# Patient Record
Sex: Female | Born: 1955 | Race: Black or African American | Hispanic: No | Marital: Single | Smoking: Never smoker
Health system: Southern US, Community
[De-identification: ages and names within clinical notes are randomized; demographics above are authoritative.]

## PROBLEM LIST (undated history)

## (undated) DIAGNOSIS — E7800 Pure hypercholesterolemia, unspecified: Secondary | ICD-10-CM

## (undated) DIAGNOSIS — I10 Essential (primary) hypertension: Secondary | ICD-10-CM

## (undated) DIAGNOSIS — E119 Type 2 diabetes mellitus without complications: Secondary | ICD-10-CM

## (undated) HISTORY — PX: REPLACEMENT TOTAL KNEE: SUR1224

## (undated) HISTORY — PX: BREAST EXCISIONAL BIOPSY: SUR124

## (undated) HISTORY — PX: BREAST SURGERY: SHX581

## (undated) HISTORY — PX: TUBAL LIGATION: SHX77

---

## 2004-03-14 ENCOUNTER — Emergency Department: Payer: Self-pay | Admitting: General Practice

## 2005-05-24 ENCOUNTER — Ambulatory Visit: Payer: Self-pay | Admitting: Unknown Physician Specialty

## 2005-06-20 ENCOUNTER — Ambulatory Visit: Payer: Self-pay | Admitting: Unknown Physician Specialty

## 2006-01-07 ENCOUNTER — Ambulatory Visit: Payer: Self-pay | Admitting: Unknown Physician Specialty

## 2006-01-26 ENCOUNTER — Emergency Department: Payer: Self-pay | Admitting: Emergency Medicine

## 2006-02-05 ENCOUNTER — Ambulatory Visit: Payer: Self-pay | Admitting: Unknown Physician Specialty

## 2006-04-16 ENCOUNTER — Ambulatory Visit: Payer: Self-pay | Admitting: Gastroenterology

## 2007-05-15 ENCOUNTER — Ambulatory Visit: Payer: Self-pay | Admitting: General Surgery

## 2007-05-15 ENCOUNTER — Other Ambulatory Visit: Payer: Self-pay

## 2007-05-21 ENCOUNTER — Ambulatory Visit: Payer: Self-pay | Admitting: General Surgery
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
## 2008-07-04 ENCOUNTER — Ambulatory Visit: Payer: Self-pay | Admitting: Unknown Physician Specialty

## 2008-07-06 ENCOUNTER — Ambulatory Visit: Payer: Self-pay | Admitting: Unknown Physician Specialty

## 2009-07-07 ENCOUNTER — Ambulatory Visit: Payer: Self-pay | Admitting: Unknown Physician Specialty

## 2010-05-29 ENCOUNTER — Ambulatory Visit: Payer: Self-pay | Admitting: Unknown Physician Specialty

## 2010-07-09 ENCOUNTER — Ambulatory Visit: Payer: Self-pay | Admitting: Unknown Physician Specialty

## 2015-12-30 ENCOUNTER — Encounter: Payer: Self-pay | Admitting: Gynecology

## 2015-12-30 ENCOUNTER — Ambulatory Visit
Admission: EM | Admit: 2015-12-30 | Discharge: 2015-12-30 | Disposition: A | Discharge status: Home / Self Care | Payer: 59

## 2015-12-30 ENCOUNTER — Ambulatory Visit (INDEPENDENT_AMBULATORY_CARE_PROVIDER_SITE_OTHER): Payer: 59

## 2015-12-30 DIAGNOSIS — M1712 Unilateral primary osteoarthritis, left knee: Secondary | ICD-10-CM

## 2015-12-30 HISTORY — DX: Pure hypercholesterolemia, unspecified: E78.00

## 2015-12-30 HISTORY — DX: Essential (primary) hypertension: I10

## 2015-12-30 HISTORY — DX: Type 2 diabetes mellitus without complications: E11.9

## 2015-12-30 MED ORDER — NAPROXEN 500 MG PO TABS: 500.0000 mg | ORAL_TABLET | Freq: Two times a day (BID) | ORAL | 0 refills | Status: AC

## 2015-12-30 MED ORDER — TRAMADOL HCL 50 MG PO TABS: 50.0000 mg | ORAL_TABLET | Freq: Two times a day (BID) | ORAL | 0 refills | Status: AC

## 2015-12-30 NOTE — ED Triage Notes (Signed)
Patient c/o right knee pain and swelling x couple weeks.

## 2015-12-30 NOTE — ED Provider Notes (Signed)
CSN: [id number]     Arrival date & time 12/30/15  1126 History   First MD Initiated Contact with Patient 12/30/15 1257     Chief Complaint  Patient presents with  . Knee Pain  . Joint Swelling   (Consider location/radiation/quality/duration/timing/severity/associated sxs/prior Treatment) HPI  This a 60 year old female who presents with left knee pain and swelling that she has had for about one month. 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 Since beginning the course she has had pain in her left knee. She has had previous right TKA from osteoarthritis and was told in 2008 at the time of the surgery that she would likely require on the opposite knee as well. She has been trying to treat it herself and has been using medications that she has had in the past but has run out and does not have an appointment at the [facility] until November 16. She has been limping because of the pain.       Past Medical History:  Diagnosis Date  . Diabetes mellitus without complication (HCC)   . High cholesterol   . Hypertension    Past Surgical History:  Procedure Laterality Date  . BREAST SURGERY    . CESAREAN SECTION    . REPLACEMENT TOTAL KNEE Left   . TUBAL LIGATION     No family history on file. Social History  Substance Use Topics  . Smoking status: Never Smoker  . Smokeless tobacco: Never Used  . Alcohol use Yes   OB History    No data available     Review of Systems  Constitutional: Positive for activity change. Negative for chills, fatigue and fever.  Musculoskeletal: Positive for arthralgias, gait problem and joint swelling.  All other systems reviewed and are negative.   Allergies  Penicillins  Home Medications   Prior to Admission medications   Medication Sig Start Date End Date Taking? Authorizing Provider  albuterol (PROVENTIL HFA;VENTOLIN HFA) 108 (90 Base) MCG/ACT inhaler Inhale into the lungs every 6 (six) hours as needed  for wheezing or shortness of breath.   Yes Historical Provider, MD  amLODipine (NORVASC) 10 MG tablet Take 10 mg by mouth daily.   Yes Historical Provider, MD  etodolac (LODINE XL) 400 MG 24 hr tablet Take 400 mg by mouth daily.   Yes Historical Provider, MD  fluticasone (FLONASE) 50 MCG/ACT nasal spray Place into both nostrils daily.   Yes Historical Provider, MD  hydrochlorothiazide (HYDRODIURIL) 25 MG tablet Take 25 mg by mouth daily.   Yes Historical Provider, MD  lisinopril (PRINIVIL,ZESTRIL) 40 MG tablet Take 40 mg by mouth daily.   Yes Historical Provider, MD  loratadine (CLARITIN) 10 MG tablet Take 10 mg by mouth daily.   Yes Historical Provider, MD  Melatonin 5 MG CAPS Take by mouth.   Yes Historical Provider, MD  metFORMIN (GLUCOPHAGE) 1000 MG tablet Take 1,000 mg by mouth 2 (two) times daily with a meal.   Yes Historical Provider, MD  methocarbamol (ROBAXIN) 500 MG tablet Take 500 mg by mouth 4 (four) times daily.   Yes Historical Provider, MD  pravastatin (PRAVACHOL) 80 MG tablet Take 80 mg by mouth daily.   Yes Historical Provider, MD  traMADol (ULTRAM) 50 MG tablet Take by mouth every 6 (six) hours as needed.   Yes Historical Provider, MD  naproxen (NAPROSYN) 500 MG tablet Take 1 tablet (500 mg total) by mouth 2 (two) times daily with a meal.  12/30/15   Lutricia Feil, PA-C  traMADol (ULTRAM) 50 MG tablet Take 1 tablet (50 mg total) by mouth 2 (two) times daily. 12/30/15   Lutricia Feil, PA-C   Meds Ordered and Administered this Visit  Medications - No data to display  BP (!) 146/75 (BP Location: Left Arm)   Pulse 71   Temp 98.1 F (36.7 C) (Oral)   Resp 16   Ht 5\' 2"  (1.575 m)   Wt 175 lb (79.4 kg)   SpO2 98%   BMI 32.01 kg/m  No data found.   Physical Exam  Constitutional: She is oriented to person, place, and time. She appears well-developed and well-nourished. No distress.  HENT:  Head: Normocephalic and atraumatic.  Eyes: EOM are normal. Pupils are equal,  round, and reactive to light.  Neck: Normal range of motion. Neck supple.  Musculoskeletal: Normal range of motion. She exhibits edema and tenderness.  Examination of the left knee shows the patient to have an antalgic gait. The knee is not hot. The quadriceps is strong with good control. She extends to 0. She does have some retropatellar tenderness. There is bilateral joint line tenderness most noticeable medially. Medial collateral and lateral collateral ligaments are intact to stressing. Is a negative anterior drawer sign.  Neurological: She is alert and oriented to person, place, and time.  Skin: Skin is warm and dry. She is not diaphoretic.  Psychiatric: She has a normal mood and affect. Her behavior is normal. Judgment and thought content normal.  Nursing note and vitals reviewed.   Urgent Care Course   Clinical Course    Procedures (including critical care time)  Labs Review Labs Reviewed - No data to display  Imaging Review Dg Knee Complete 4 Views Left  Result Date: 12/30/2015 CLINICAL DATA:  60 year old female with medial pain and swelling of the left knee for the past 2 weeks. Difficulty walking. EXAM: LEFT KNEE - COMPLETE 4+ VIEW COMPARISON:  No priors. FINDINGS: Three views of the left knee demonstrate no acute displaced fracture, subluxation or dislocation. There is joint space narrowing, subchondral sclerosis, subchondral cyst formation and osteophyte formation in a tricompartmental distribution, most severe in the medial and patellofemoral compartments, compatible with moderate to severe osteoarthritis. IMPRESSION: 1. Moderate to severe tricompartmental osteoarthritis, most severe in the medial and patellofemoral compartments. Electronically Signed   By: Trudie Reed M.D.   On: 12/30/2015 13:40     Visual Acuity Review  Right Eye Distance:   Left Eye Distance:   Bilateral Distance:    Right Eye Near:   Left Eye Near:    Bilateral Near:         MDM   1.  Tricompartment osteoarthritis of left knee    New Prescriptions   NAPROXEN (NAPROSYN) 500 MG TABLET    Take 1 tablet (500 mg total) by mouth 2 (two) times daily with a meal.   TRAMADOL (ULTRAM) 50 MG TABLET    Take 1 tablet (50 mg total) by mouth 2 (two) times daily.  Plan: 1. Test/x-ray results and diagnosis reviewed with patient 2. rx as per orders; risks, benefits, potential side effects reviewed with patient 3. Recommend supportive treatment with Symptom avoidance and rest. Told the patient that it probably is dangerous for her to be trying to defend herself with her knee as inflamed and osteoarthritic as it is. She should take this into consideration. However she wants to continue with her training. She requested medication "to get me through" additional  2 weeks of training she requires. No peri-small supply of tramadol that she may use at nighttime to help her rest. Otherwise I will stop the Lodine and switch her to Naprosyn twice a day dosage she will take this with food. Is to follow-up with the [facility] on November 16 for consideration of a TKA on the left 4. F/u prn if symptoms worsen or don't improve     Lutricia Feil, PA-C 12/30/15 1429

## 2015-12-30 NOTE — Discharge Instructions (Signed)
Stop taking Lodine and now start taking Naprosyn twice a day with food. Tramadol for only severe knee pain. Will need to [facility] with [facility].

## 2017-12-09 IMAGING — CR DG KNEE COMPLETE 4+V*L*
4 series · 4 of 4 positions shown · non-contrast
Comparison: No priors.

CLINICAL DATA: 59-year-old female with medial pain and swelling of
the left knee for the past 2 weeks. Difficulty walking.

EXAM:
LEFT KNEE - COMPLETE 4+ VIEW

[knee ap (1 of 3)]
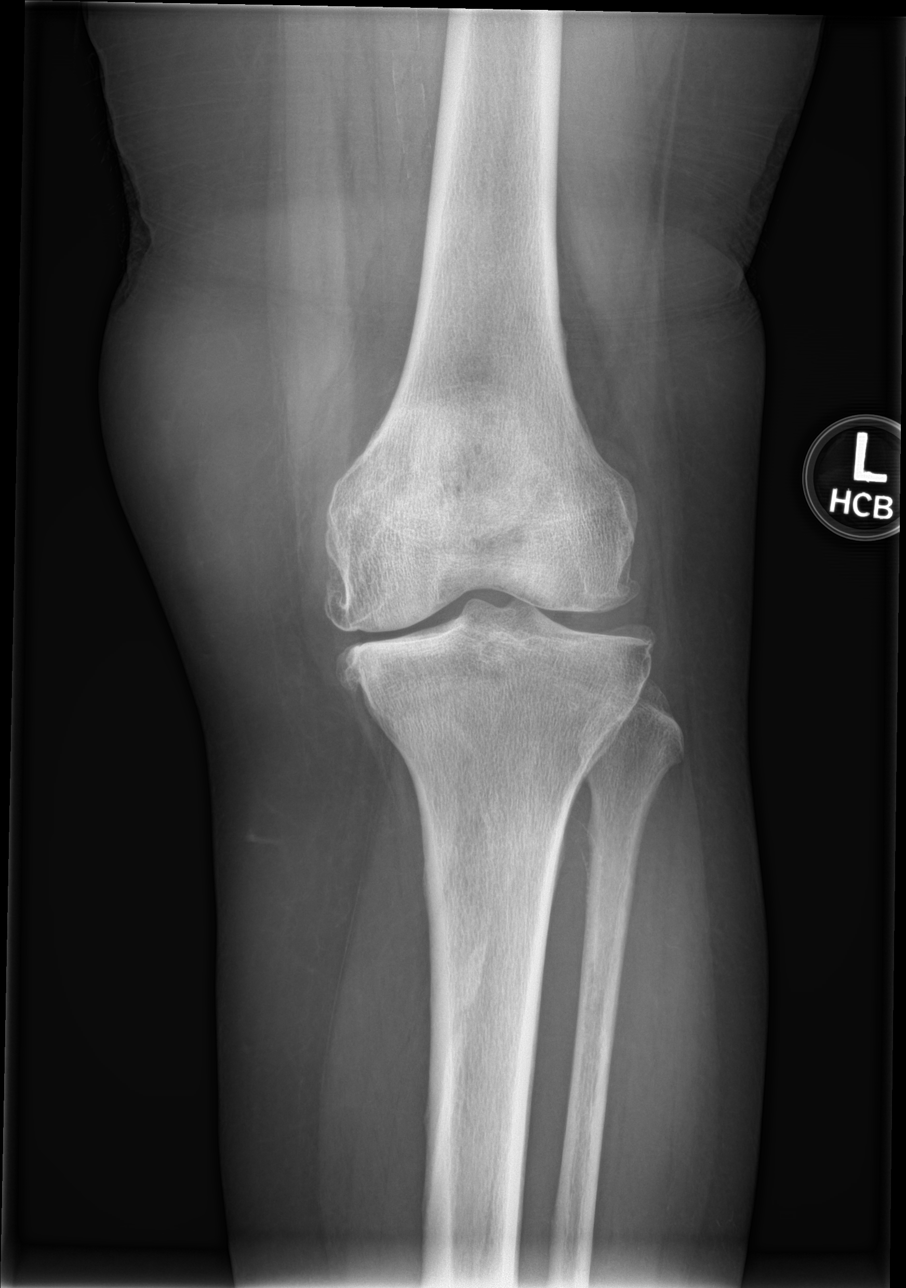

[knee lat]
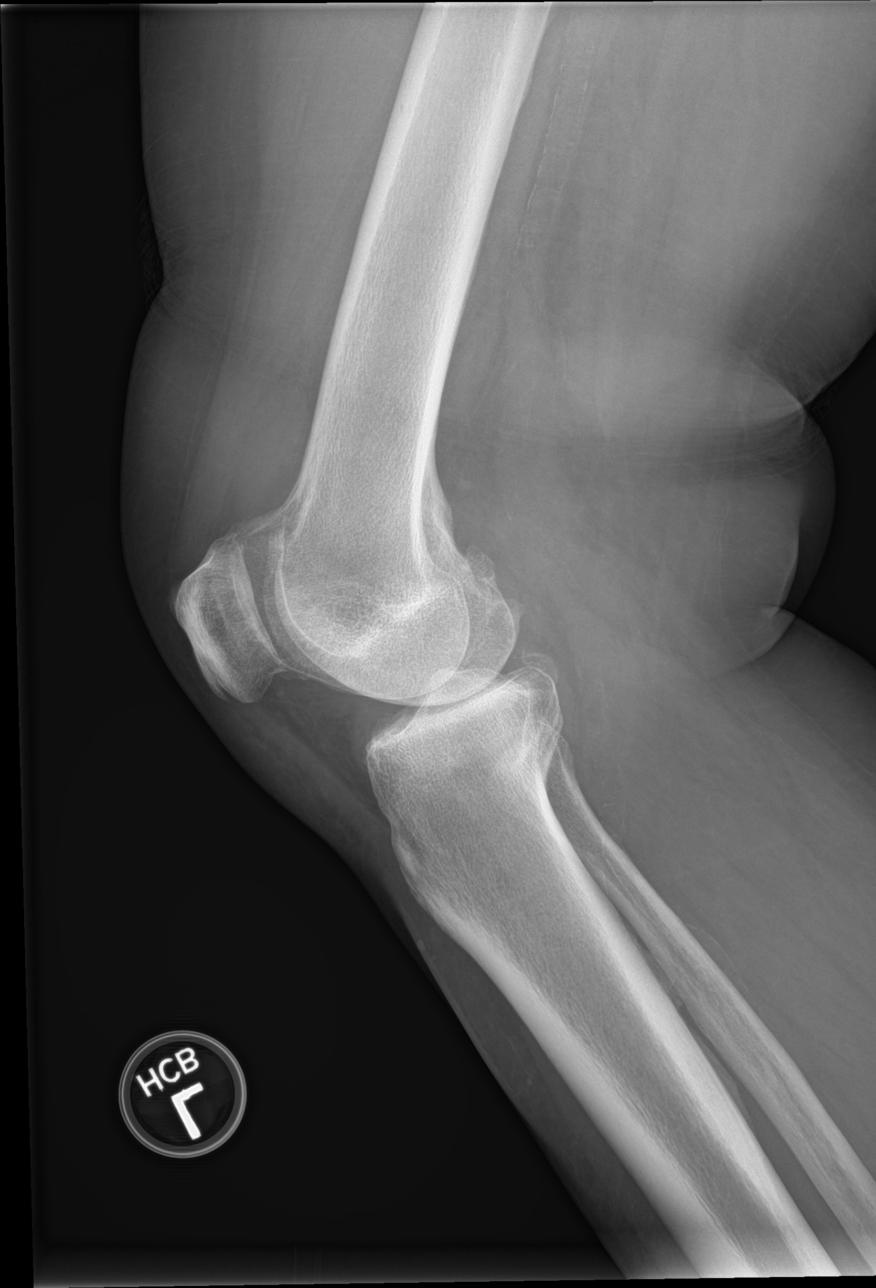

[knee ap (2 of 3)]
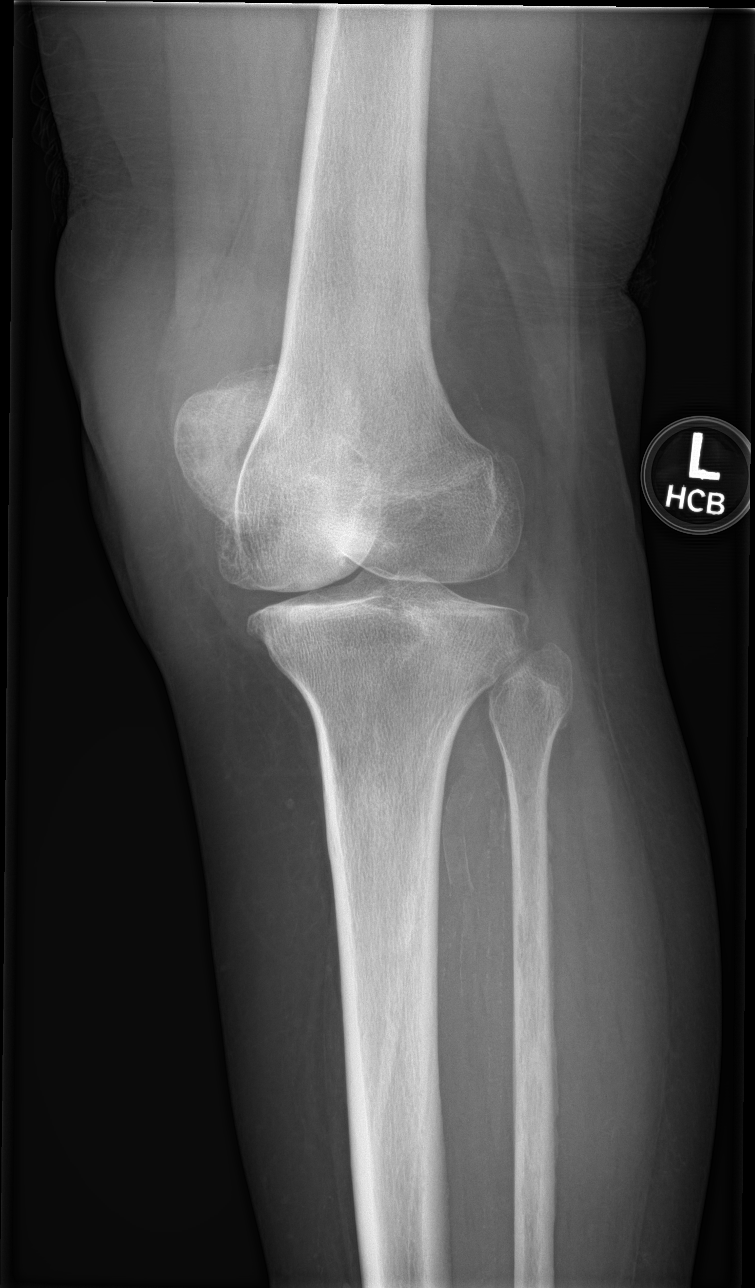

[knee ap (3 of 3)]
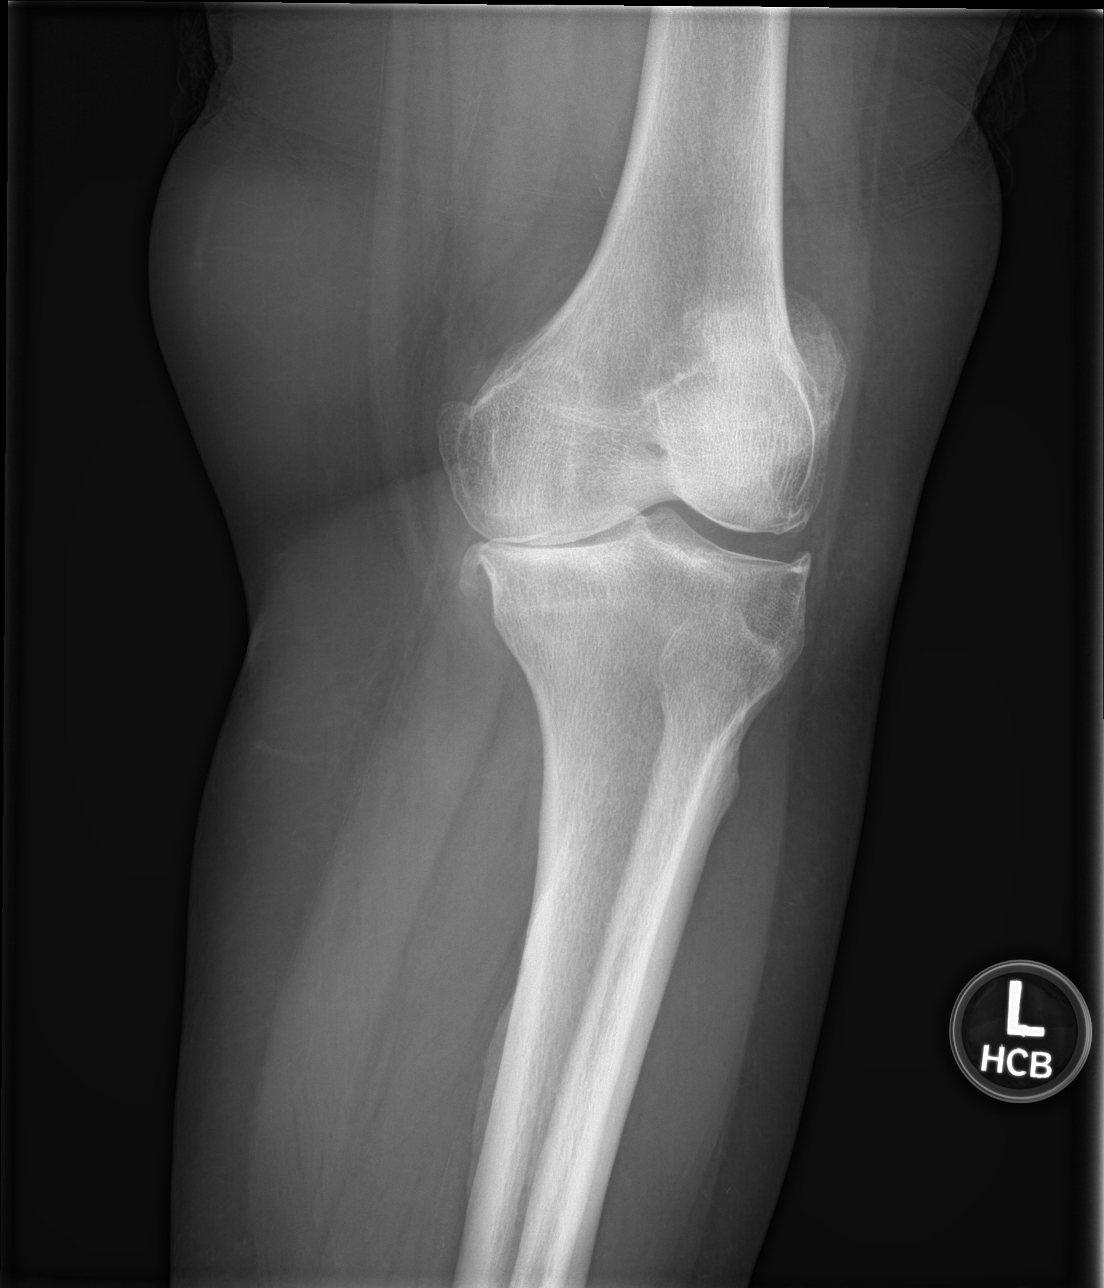

[4 of 4 positions shown; findings below may reference images not displayed]

FINDINGS: Three views of the left knee demonstrate no acute displaced
fracture, subluxation or dislocation. There is joint space
narrowing, subchondral sclerosis, subchondral cyst formation and
osteophyte formation in a tricompartmental distribution, most severe
in the medial and patellofemoral compartments, compatible with
moderate to severe osteoarthritis.
IMPRESSION: 1. Moderate to severe tricompartmental osteoarthritis, most severe
in the medial and patellofemoral compartments.

## 2024-01-14 ENCOUNTER — Other Ambulatory Visit: Payer: Self-pay | Admitting: Student

## 2024-01-14 DIAGNOSIS — Z1231 Encounter for screening mammogram for malignant neoplasm of breast: Secondary | ICD-10-CM

## 2024-02-09 ENCOUNTER — Other Ambulatory Visit: Payer: Self-pay | Admitting: *Deleted

## 2024-02-09 ENCOUNTER — Inpatient Hospital Stay
Admission: RE | Admit: 2024-02-09 | Discharge: 2024-02-09 | Disposition: A | Payer: Self-pay | Source: Ambulatory Visit | Attending: Student | Admitting: Student

## 2024-02-09 DIAGNOSIS — Z1231 Encounter for screening mammogram for malignant neoplasm of breast: Secondary | ICD-10-CM

## 2024-02-18 ENCOUNTER — Ambulatory Visit
Admission: RE | Admit: 2024-02-18 | Discharge: 2024-02-18 | Disposition: A | Discharge status: Home / Self Care | Source: Ambulatory Visit | Attending: Student | Admitting: Student

## 2024-02-18 DIAGNOSIS — Z1231 Encounter for screening mammogram for malignant neoplasm of breast: Secondary | ICD-10-CM | POA: Insufficient documentation
